# Patient Record
Sex: Female | Born: 1970 | Race: White | Hispanic: No | Marital: Single
Health system: Southern US, Community
[De-identification: ages and names within clinical notes are randomized; demographics above are authoritative.]

---

## 2012-03-07 ENCOUNTER — Ambulatory Visit: Payer: Self-pay | Admitting: Family Medicine

## 2013-02-05 ENCOUNTER — Ambulatory Visit: Payer: Self-pay

## 2013-03-27 ENCOUNTER — Ambulatory Visit: Payer: Self-pay | Admitting: Family Medicine

## 2014-03-05 ENCOUNTER — Ambulatory Visit: Payer: Self-pay | Admitting: Family Medicine

## 2014-08-07 ENCOUNTER — Ambulatory Visit: Payer: Self-pay | Admitting: Family Medicine

## 2016-02-06 ENCOUNTER — Ambulatory Visit: Admission: RE | Admit: 2016-02-06 | Payer: No Typology Code available for payment source | Source: Ambulatory Visit

## 2016-02-06 ENCOUNTER — Other Ambulatory Visit: Payer: Self-pay | Admitting: Family Medicine

## 2016-02-06 DIAGNOSIS — R1032 Left lower quadrant pain: Secondary | ICD-10-CM

## 2016-06-03 ENCOUNTER — Other Ambulatory Visit: Payer: Self-pay | Admitting: Family Medicine

## 2016-06-03 DIAGNOSIS — Z1231 Encounter for screening mammogram for malignant neoplasm of breast: Secondary | ICD-10-CM

## 2016-06-16 ENCOUNTER — Ambulatory Visit: Payer: No Typology Code available for payment source

## 2016-06-16 ENCOUNTER — Ambulatory Visit: Admission: RE | Admit: 2016-06-16 | Payer: No Typology Code available for payment source | Source: Ambulatory Visit

## 2016-06-21 ENCOUNTER — Ambulatory Visit
Admission: RE | Admit: 2016-06-21 | Discharge: 2016-06-21 | Disposition: A | Discharge status: Home / Self Care | Payer: No Typology Code available for payment source | Source: Ambulatory Visit | Attending: Family Medicine | Admitting: Family Medicine

## 2016-06-21 ENCOUNTER — Encounter: Payer: Self-pay | Admitting: Radiology

## 2016-06-21 DIAGNOSIS — Z1231 Encounter for screening mammogram for malignant neoplasm of breast: Secondary | ICD-10-CM | POA: Diagnosis not present

## 2017-09-01 ENCOUNTER — Other Ambulatory Visit: Payer: Self-pay | Admitting: Family Medicine

## 2017-09-01 DIAGNOSIS — Z1231 Encounter for screening mammogram for malignant neoplasm of breast: Secondary | ICD-10-CM

## 2017-09-05 ENCOUNTER — Encounter (INDEPENDENT_AMBULATORY_CARE_PROVIDER_SITE_OTHER): Payer: Self-pay

## 2017-09-05 ENCOUNTER — Ambulatory Visit
Admission: RE | Admit: 2017-09-05 | Discharge: 2017-09-05 | Disposition: A | Discharge status: Home / Self Care | Payer: 59 | Source: Ambulatory Visit | Attending: Family Medicine | Admitting: Family Medicine

## 2017-09-05 DIAGNOSIS — Z1231 Encounter for screening mammogram for malignant neoplasm of breast: Secondary | ICD-10-CM | POA: Insufficient documentation

## 2019-05-08 ENCOUNTER — Other Ambulatory Visit: Payer: Self-pay | Admitting: Medical Oncology

## 2019-05-08 DIAGNOSIS — Z1231 Encounter for screening mammogram for malignant neoplasm of breast: Secondary | ICD-10-CM

## 2019-05-17 ENCOUNTER — Ambulatory Visit
Admission: RE | Admit: 2019-05-17 | Discharge: 2019-05-17 | Disposition: A | Discharge status: Home / Self Care | Payer: 59 | Source: Ambulatory Visit | Attending: Medical Oncology | Admitting: Medical Oncology

## 2019-05-17 ENCOUNTER — Other Ambulatory Visit: Payer: Self-pay

## 2019-05-17 DIAGNOSIS — Z1231 Encounter for screening mammogram for malignant neoplasm of breast: Secondary | ICD-10-CM | POA: Diagnosis not present

## 2019-09-01 ENCOUNTER — Ambulatory Visit: Payer: Self-pay | Attending: Internal Medicine

## 2019-09-01 DIAGNOSIS — Z23 Encounter for immunization: Secondary | ICD-10-CM

## 2019-09-01 NOTE — Progress Notes (Signed)
   Covid-19 Vaccination Clinic  Name:  Ariana Rivera    MRN: [id number] DOB: 1971-03-06  09/01/2019  Ariana Rivera was observed post Covid-19 immunization for 15 minutes without incident. She was provided with Vaccine Information Sheet and instruction to access the V-Safe system.   Ariana Rivera was instructed to call 911 with any severe reactions post vaccine: Marland Kitchen Difficulty breathing  . Swelling of face and throat  . A fast heartbeat  . A bad rash all over body  . Dizziness and weakness   Immunizations Administered    Name Date Dose VIS Date Route   Pfizer COVID-19 Vaccine 09/01/2019 10:51 AM 0.3 mL 05/11/2019 Intramuscular   Manufacturer: [employer], [employer]   Lot: [id number]   NDC: [address]-3

## 2019-09-26 ENCOUNTER — Ambulatory Visit: Payer: Self-pay | Attending: Internal Medicine

## 2019-09-26 DIAGNOSIS — Z23 Encounter for immunization: Secondary | ICD-10-CM

## 2019-09-26 NOTE — Progress Notes (Signed)
   Covid-19 Vaccination Clinic  Name:  Ariana Rivera    MRN: [id number] DOB: May 22, 1971  09/26/2019  [name] was observed post Covid-19 immunization for 15 minutes without incident. She was provided with Vaccine Information Sheet and instruction to access the V-Safe system.   [name] was instructed to call 911 with any severe reactions post vaccine: Marland Kitchen Difficulty breathing  . Swelling of face and throat  . A fast heartbeat  . A bad rash all over body  . Dizziness and weakness   Immunizations Administered    Name Date Dose VIS Date Route   Pfizer COVID-19 Vaccine 09/26/2019  4:05 PM 0.3 mL 07/25/2018 Intramuscular   Manufacturer: [employer], [employer]   Lot: [id number]   NDC: [address]-7

## 2020-05-08 ENCOUNTER — Other Ambulatory Visit: Payer: Self-pay | Admitting: Medical Oncology

## 2020-05-08 DIAGNOSIS — Z1231 Encounter for screening mammogram for malignant neoplasm of breast: Secondary | ICD-10-CM

## 2020-05-20 ENCOUNTER — Other Ambulatory Visit: Payer: Self-pay

## 2020-05-20 ENCOUNTER — Ambulatory Visit
Admission: RE | Admit: 2020-05-20 | Discharge: 2020-05-20 | Disposition: A | Discharge status: Home / Self Care | Payer: BC Managed Care – PPO | Source: Ambulatory Visit | Attending: Medical Oncology | Admitting: Medical Oncology

## 2020-05-20 DIAGNOSIS — Z1231 Encounter for screening mammogram for malignant neoplasm of breast: Secondary | ICD-10-CM | POA: Insufficient documentation

## 2021-06-09 ENCOUNTER — Other Ambulatory Visit: Payer: Self-pay | Admitting: Family Medicine

## 2021-06-09 DIAGNOSIS — Z1231 Encounter for screening mammogram for malignant neoplasm of breast: Secondary | ICD-10-CM

## 2021-08-17 ENCOUNTER — Other Ambulatory Visit: Payer: Self-pay

## 2021-08-17 ENCOUNTER — Ambulatory Visit
Admission: RE | Admit: 2021-08-17 | Discharge: 2021-08-17 | Disposition: A | Discharge status: Home / Self Care | Payer: 59 | Source: Ambulatory Visit | Attending: Family Medicine | Admitting: Family Medicine

## 2021-08-17 DIAGNOSIS — Z1231 Encounter for screening mammogram for malignant neoplasm of breast: Secondary | ICD-10-CM | POA: Diagnosis present

## 2021-08-20 ENCOUNTER — Other Ambulatory Visit: Payer: Self-pay | Admitting: Family Medicine

## 2021-08-20 DIAGNOSIS — R921 Mammographic calcification found on diagnostic imaging of breast: Secondary | ICD-10-CM

## 2021-08-20 DIAGNOSIS — R928 Other abnormal and inconclusive findings on diagnostic imaging of breast: Secondary | ICD-10-CM

## 2021-09-02 ENCOUNTER — Ambulatory Visit
Admission: RE | Admit: 2021-09-02 | Discharge: 2021-09-02 | Disposition: A | Discharge status: Home / Self Care | Payer: 59 | Source: Ambulatory Visit | Attending: Family Medicine | Admitting: Family Medicine

## 2021-09-02 DIAGNOSIS — R928 Other abnormal and inconclusive findings on diagnostic imaging of breast: Secondary | ICD-10-CM | POA: Diagnosis not present

## 2021-09-02 DIAGNOSIS — R921 Mammographic calcification found on diagnostic imaging of breast: Secondary | ICD-10-CM | POA: Diagnosis present

## 2022-05-21 IMAGING — MG MM DIGITAL DIAGNOSTIC UNILAT*R* W/ TOMO W/ CAD
4 series · 4 of 8 positions shown · non-contrast
Comparison: Previous exam(s).

CLINICAL DATA: Callback for RIGHT breast calcifications.

EXAM:
DIGITAL DIAGNOSTIC UNILATERAL RIGHT MAMMOGRAM WITH TOMOSYNTHESIS AND
CAD
TECHNIQUE: Right digital diagnostic mammography and breast tomosynthesis was
performed. The images were evaluated with computer-aided detection.

[R CC]
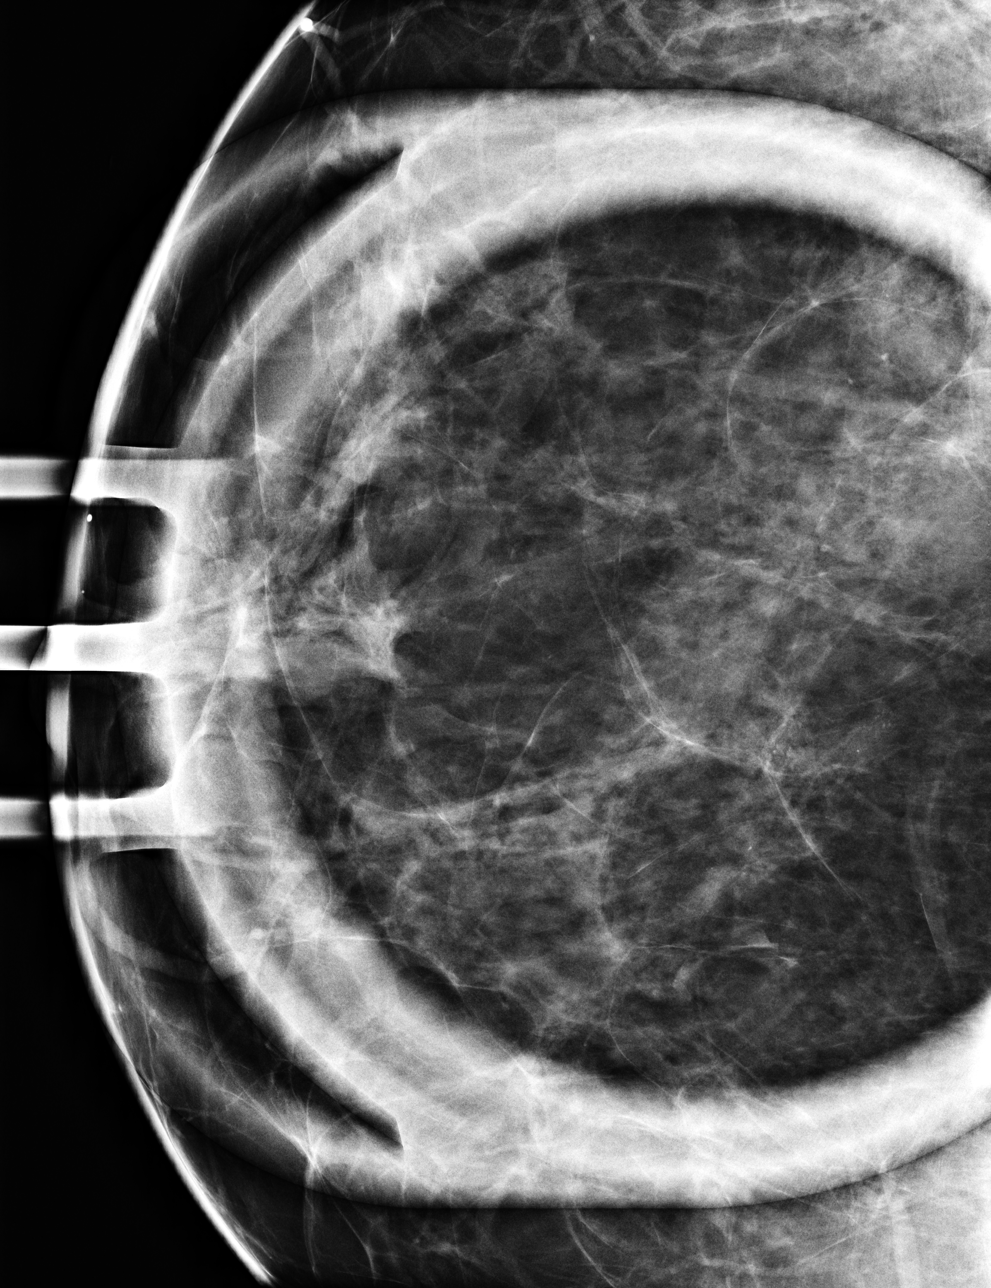

[R LM]
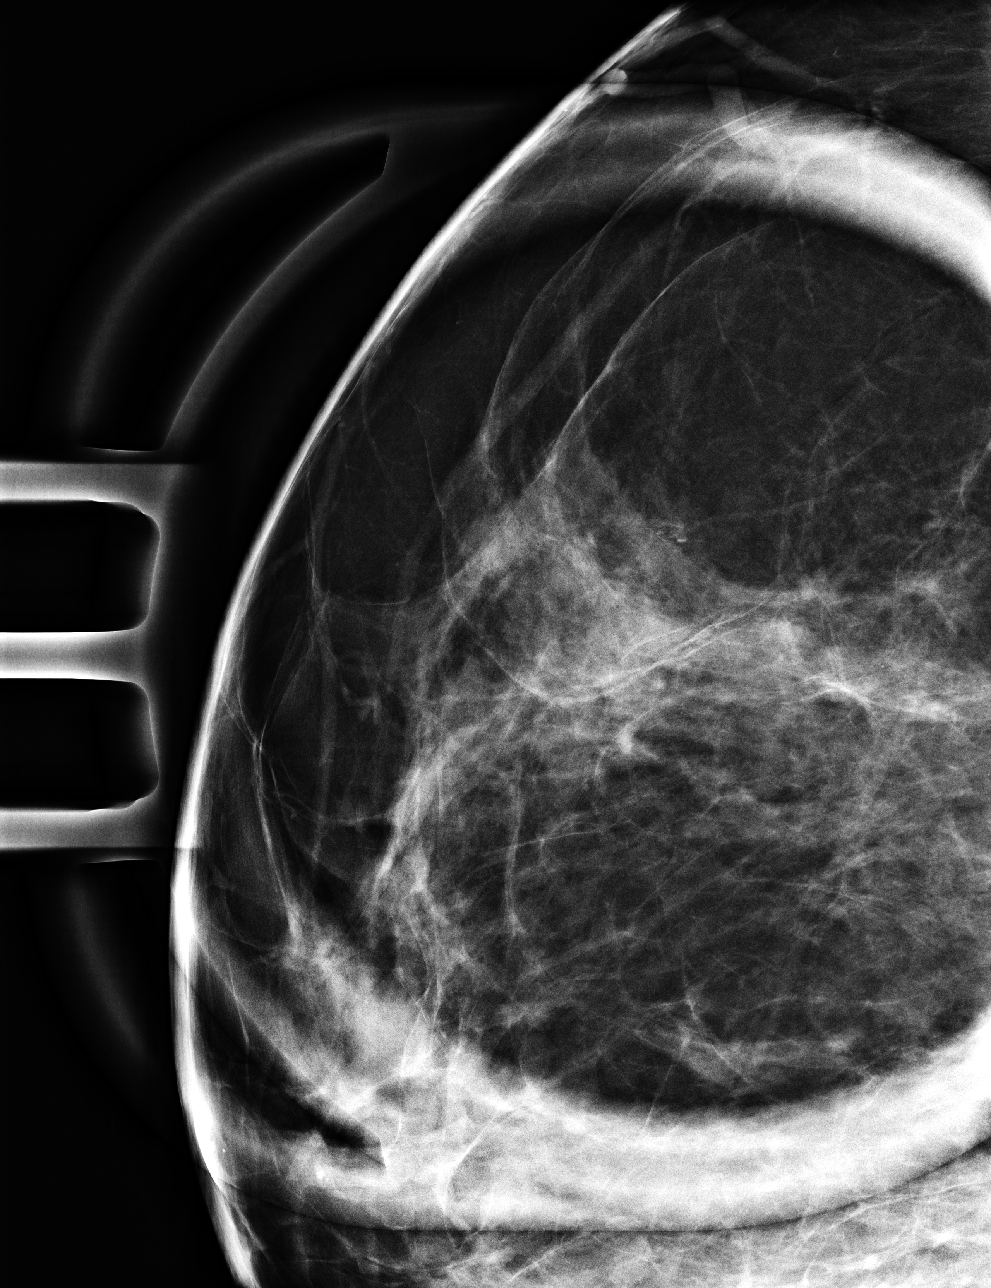

[R ML synth-2D]
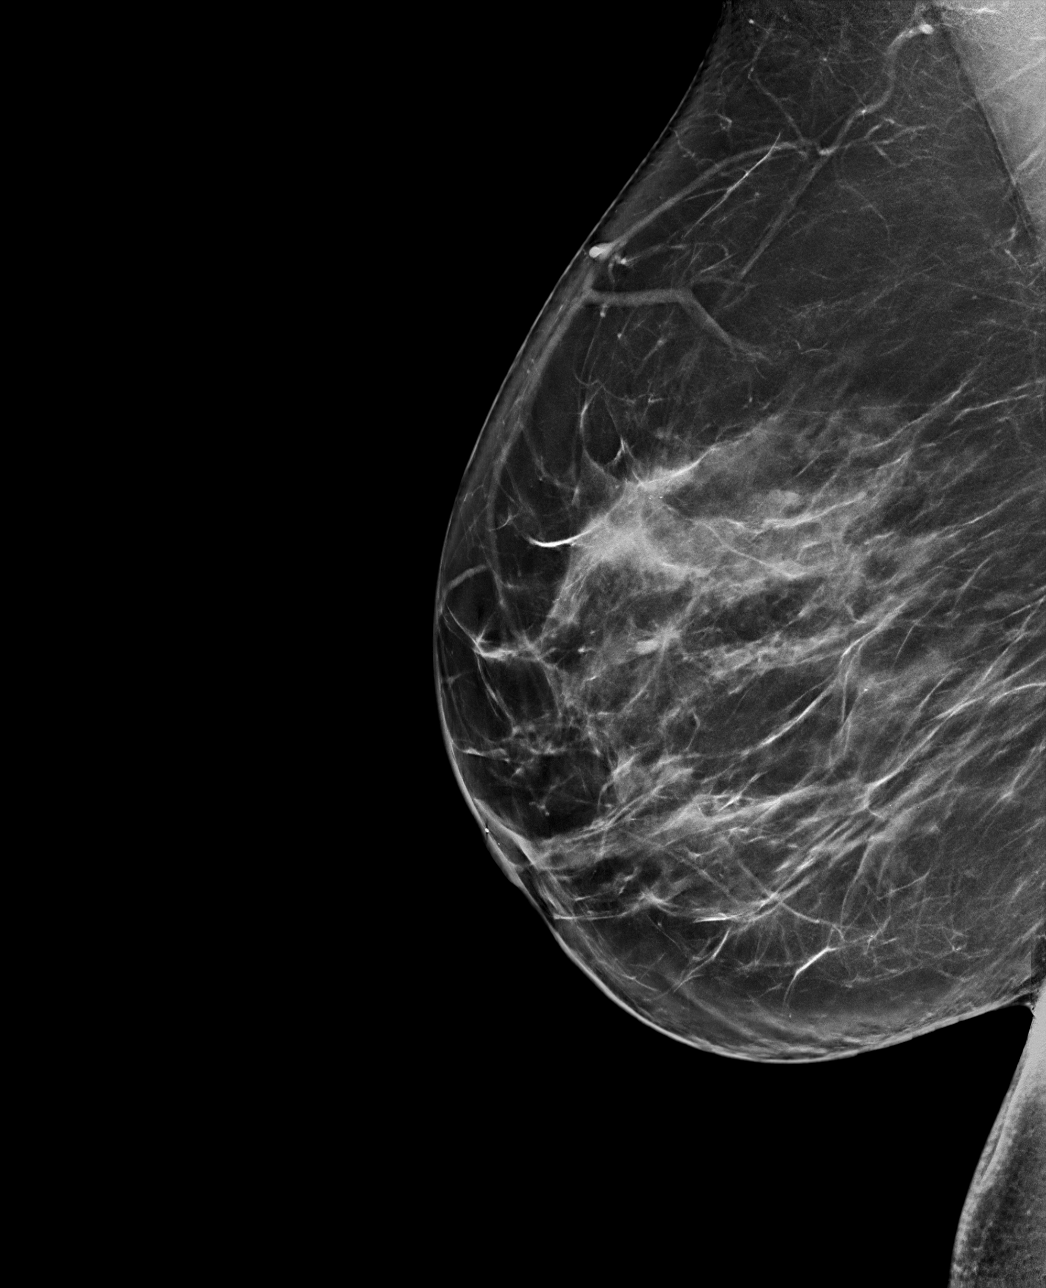

[R ML tomo · tomo slice 45/89.0]
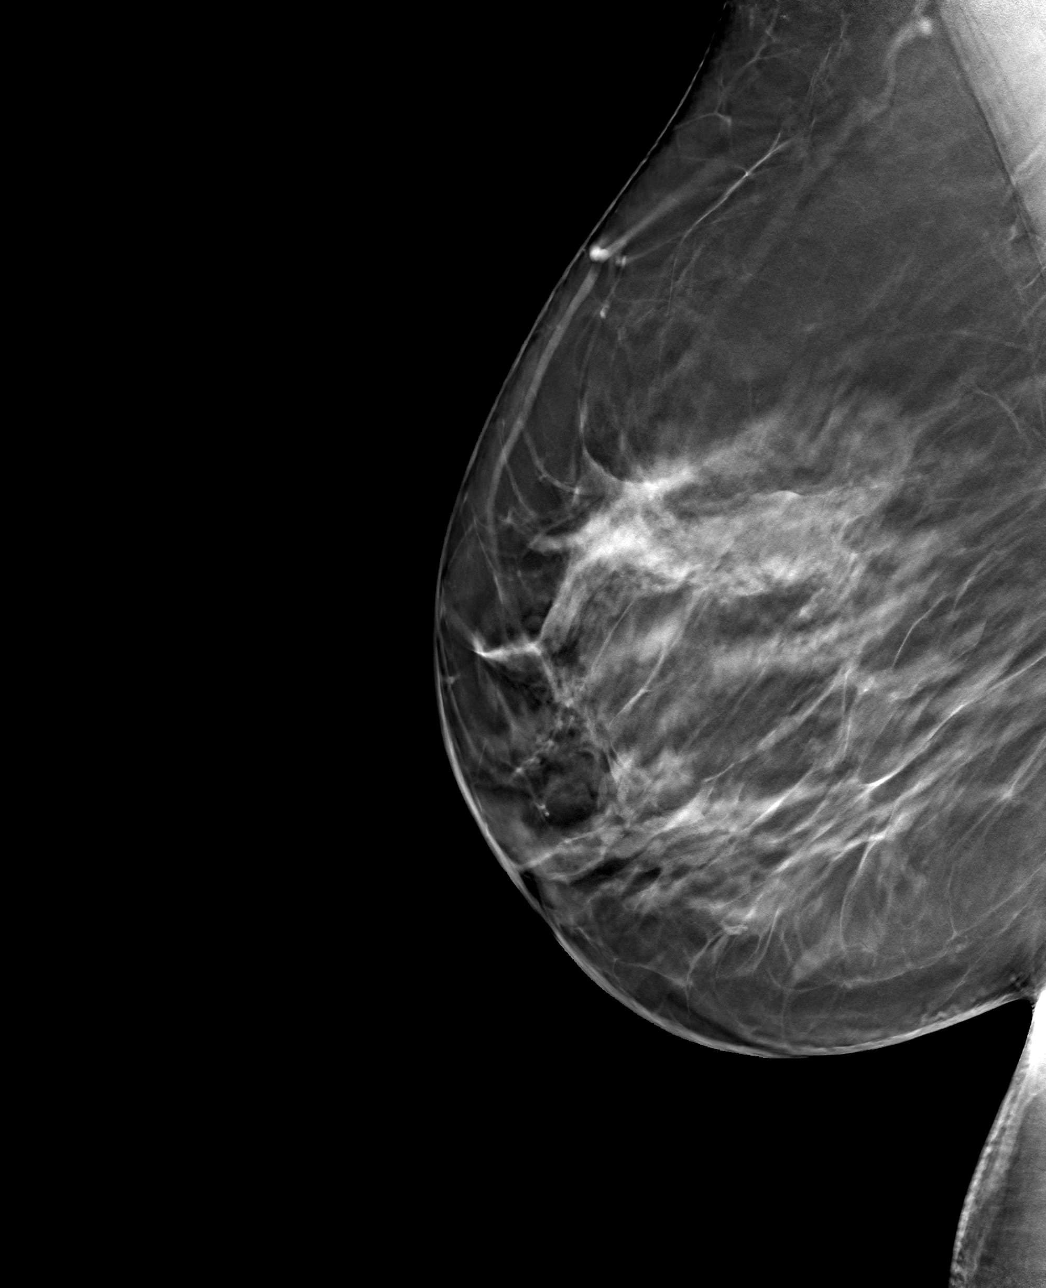

[4 of 8 positions shown; findings below may reference images not displayed]

ACR Breast Density Category c: The breast tissue is heterogeneously
dense, which may obscure small masses.
FINDINGS: Spot magnification views of the RIGHT breast demonstrate a 4 mm
group of layering calcifications at the site of screening
mammographic concern in the upper breast at middle depth. This is
consistent with benign milk of calcium. No suspicious mass,
distortion, or microcalcifications are identified to suggest
presence of malignancy.
IMPRESSION: There are benign calcifications at the site of screening
mammographic concern. No mammographic evidence of malignancy.

RECOMMENDATION:
Screening mammogram in one year.(Code:[id number])

I have discussed the findings and recommendations with the patient.
If applicable, a reminder letter will be sent to the patient
regarding the next appointment.

BI-RADS CATEGORY  2: Benign.

## 2022-07-26 ENCOUNTER — Other Ambulatory Visit: Payer: Self-pay | Admitting: Physician Assistant

## 2022-07-26 DIAGNOSIS — Z1231 Encounter for screening mammogram for malignant neoplasm of breast: Secondary | ICD-10-CM

## 2022-09-06 ENCOUNTER — Ambulatory Visit
Admission: RE | Admit: 2022-09-06 | Discharge: 2022-09-06 | Disposition: A | Discharge status: Home / Self Care | Payer: 59 | Source: Ambulatory Visit | Attending: Physician Assistant | Admitting: Physician Assistant

## 2022-09-06 DIAGNOSIS — Z1231 Encounter for screening mammogram for malignant neoplasm of breast: Secondary | ICD-10-CM | POA: Diagnosis present

## 2023-08-30 ENCOUNTER — Other Ambulatory Visit: Payer: Self-pay | Admitting: Physician Assistant

## 2023-08-30 DIAGNOSIS — Z1231 Encounter for screening mammogram for malignant neoplasm of breast: Secondary | ICD-10-CM

## 2023-09-08 ENCOUNTER — Ambulatory Visit
Admission: RE | Admit: 2023-09-08 | Discharge: 2023-09-08 | Disposition: A | Discharge status: Home / Self Care | Source: Ambulatory Visit | Attending: Physician Assistant | Admitting: Physician Assistant

## 2023-09-08 DIAGNOSIS — Z1231 Encounter for screening mammogram for malignant neoplasm of breast: Secondary | ICD-10-CM | POA: Insufficient documentation
